# Patient Record
Sex: Male | Born: 1960
Health system: Southern US, Community
[De-identification: ages and names within clinical notes are randomized; demographics above are authoritative.]

## PROBLEM LIST (undated history)

## (undated) DIAGNOSIS — R7303 Prediabetes: Secondary | ICD-10-CM

## (undated) DIAGNOSIS — I10 Essential (primary) hypertension: Secondary | ICD-10-CM

## (undated) DIAGNOSIS — J302 Other seasonal allergic rhinitis: Secondary | ICD-10-CM

## (undated) HISTORY — PX: TONSILLECTOMY: SUR1361

## (undated) HISTORY — DX: Other seasonal allergic rhinitis: J30.2

## (undated) HISTORY — DX: Essential (primary) hypertension: I10

## (undated) HISTORY — DX: Prediabetes: R73.03

---

## 2012-03-23 ENCOUNTER — Other Ambulatory Visit: Payer: Self-pay | Admitting: Occupational Medicine

## 2012-03-23 ENCOUNTER — Ambulatory Visit
Admission: RE | Admit: 2012-03-23 | Discharge: 2012-03-23 | Disposition: A | Discharge status: Home / Self Care | Payer: No Typology Code available for payment source | Source: Ambulatory Visit | Attending: Occupational Medicine | Admitting: Occupational Medicine

## 2012-03-23 DIAGNOSIS — Z0000 Encounter for general adult medical examination without abnormal findings: Secondary | ICD-10-CM

## 2013-08-29 HISTORY — PX: EYE SURGERY: SHX253

## 2014-03-01 DIAGNOSIS — R7303 Prediabetes: Secondary | ICD-10-CM

## 2014-03-01 HISTORY — DX: Prediabetes: R73.03

## 2014-07-30 ENCOUNTER — Ambulatory Visit (INDEPENDENT_AMBULATORY_CARE_PROVIDER_SITE_OTHER): Payer: BLUE CROSS/BLUE SHIELD | Admitting: Neurology

## 2014-07-30 ENCOUNTER — Encounter: Payer: Self-pay | Admitting: Neurology

## 2014-07-30 VITALS — BP 143/93 | HR 68 | Temp 97.6°F | Ht 73.0 in | Wt 208.2 lb

## 2014-07-30 DIAGNOSIS — G609 Hereditary and idiopathic neuropathy, unspecified: Secondary | ICD-10-CM | POA: Diagnosis not present

## 2014-07-30 DIAGNOSIS — M5417 Radiculopathy, lumbosacral region: Secondary | ICD-10-CM | POA: Insufficient documentation

## 2014-07-30 NOTE — Patient Instructions (Signed)
Overall you are doing fairly well but I do want to suggest a few things today:   Remember to drink plenty of fluid, eat healthy meals and do not skip any meals. Try to eat protein with a every meal and eat a healthy snack such as fruit or nuts in between meals. Try to keep a regular sleep-wake schedule and try to exercise daily, particularly in the form of walking, 20-30 minutes a day, if you can.   As far as your medications are concerned, I would like to suggest: none at this time  As far as diagnostic testing: CT of the lumbar spine, labs  I would like to see you back as needed, sooner if we need to. Please call us with any interim questions, concerns, problems, updates or refill requests.   Please also call us for any test results so we can go over those with you on the phone.  My clinical assistant and will answer any of your questions and relay your messages to me and also relay most of my messages to you.   Our phone number is [phone]. We also have an after hours call service for urgent matters and there is a physician on-call for urgent questions. For any emergencies you know to call 911 or go to the nearest emergency room

## 2014-07-30 NOTE — Progress Notes (Signed)
[facility]    Provider:  Dr Jaynee Eagles Referring Provider: Hulan Fess, MD Primary Care Physician:  Gennette Pac, MD  CC:  Peripheral neuropathy  HPI:  Joseph Wilkinson is a 54 y.o. male here as a referral from Dr. Rex Kras for numbness in the toes. He has a past medical history of mixed dyslipidemia and prediabetes, dysthymia, hypertension. 3 years ago he started having numbness in the right foot, specifically tips of toes. Symptoms have spread to the left foot. The balls of the feet are involved too. He had a skin biopsy confirmed neuropathy. He started taking B12, B6 and folate however he denies any deficiencies. HgbA1c is 5.6. He follows with Hulan Fess, pcp. Symptoms not painful, but are disturbing and irritating. Worse with walking long distances. Less when he is laying down. In the morning he notices it more. Hurts more when he steps on a pebble, feels more a sense of pain than he should. No cramping. No current low back pain, but years ago he had a few instance of  radicular pain  (14-15 years ago) which resolved. He has some "twinges" that he recovered from but no issues in 7-8 years with his low back. No balance issues. No cramping. No changes in bowel or bladder.  Reviewed notes, labs and imaging from outside physicians, which showed: Last hemoglobin A1c was 5.8%, LDL 103. He was diagnosed with peripheral neuropathy after a biopsy, and no nerve conduction study has been done and has been receiving injections of vitamin B complex. He has a history of lumbar spine disease about 15 years ago, has had an MRI at that time but not since. He does see a Restaurant manager, fast food. He has also had tubular adenoma of the colon, colonoscopy up-to-date to be rechecked in March 2018.  Review of Systems: Patient complains of symptoms per HPI as well as the following symptoms: Weakness and allergies. Pertinent negatives per HPI. All others negative.   History   Social History  . Marital  Status: Married    Spouse Name: Almyra Free   . Number of Children: 2  . Years of Education: 16   Occupational History  . [employer]    Social History Main Topics  . Smoking status: Never Smoker   . Smokeless tobacco: Not on file  . Alcohol Use: 0.0 oz/week    0 Standard drinks or equivalent per week     Comment: 2-4 drinks week   . Drug Use: No  . Sexual Activity: Not on file   Other Topics Concern  . Not on file   Social History Narrative   Lives at home with wife and 2 kinds   Caffeine use: ocass tea    Family History  Problem Relation Age of Onset  . COPD Mother   . Ovarian cancer Father   . Seizures Daughter     Benign epilepsy     Past Medical History  Diagnosis Date  . Hypertension   . Pre-diabetes 2016  . Seasonal allergies     Past Surgical History  Procedure Laterality Date  . Tonsillectomy    . Eye surgery  July 2015    Scerla spacing     Current Outpatient Prescriptions  Medication Sig Dispense Refill  . Folic Acid-Vit G9-JME Q68 (FA-VITAMIN B-6-VITAMIN B-12 PO) Take 2 tablets by mouth. Every 2 days     No current facility-administered medications for this visit.    Allergies as of 07/30/2014  . (No Known Allergies)  Vitals: BP 143/93 mmHg  Pulse 68  Temp(Src) 97.6 F (36.4 C)  Ht 6\' 1"  (1.854 m)  Wt 208 lb 3.2 oz (94.439 kg)  BMI 27.47 kg/m2 Last Weight:  Wt Readings from Last 1 Encounters:  07/30/14 208 lb 3.2 oz (94.439 kg)   Last Height:   Ht Readings from Last 1 Encounters:  07/30/14 6\' 1"  (1.854 m)    Physical exam: Exam: Gen: NAD, conversant, well nourised, well groomed                     CV: RRR, no MRG. No Carotid Bruits. No peripheral edema, warm, nontender Eyes: Conjunctivae clear without exudates or hemorrhage  Neuro: Detailed Neurologic Exam  Speech:    Speech is normal; fluent and spontaneous with normal comprehension.  Cognition:    The patient is oriented to person, place, and time;      recent and remote memory intact;     language fluent;     normal attention, concentration,     fund of knowledge Cranial Nerves:    The pupils are equal, round, and reactive to light. The fundi are flat. Visual fields are full to finger confrontation. Extraocular movements are intact. Trigeminal sensation is intact and the muscles of mastication are normal. The face is symmetric. The palate elevates in the midline. Hearing intact. Voice is normal. Shoulder shrug is normal. The tongue has normal motion without fasciculations.   Coordination:    Normal finger to nose and heel to shin. Normal rapid alternating movements.   Gait:    Heel-toe and tandem gait are normal.   Motor Observation:    No asymmetry, no atrophy, and no involuntary movements noted. Tone:    Normal muscle tone.    Posture:    Posture is normal. normal erect    Strength:    Strength is V/V in the upper and lower limbs.      Sensation: Decreased in a stocking distribution to pp  and temp. vibration and proprioception intact distally.     Reflex Exam:  DTR's:    Deep tendon reflexes in the upper and lower extremities are normal bilaterally.   Toes:    The toes are downgoing bilaterally.   Clonus:    Clonus is absent.      Assessment/Plan:  54 year old very nice male with a history of small fiber neuropathy, by report verified with skin biopsy. Here for evaluation of his neuropathy as well as radiculopathy. Neuro exam significant for symmetrical small fiber greater than large fiber distal sensory changes. Otherwise unremarkable neurologic exam.  We'll order a complete serum workup for neuropathy. Patient is concerned that these changes might be coming from his low back, has a history of lumbar radiculopathy. Do not feel an MRI of the low back is warranted at this time but can check a CT to ensure no significant degenerative changes.   Sarina Ill, MD  [facility] [address]  [place], [place] [address]  Phone [phone] Fax [phone]

## 2014-08-01 LAB — COMPREHENSIVE METABOLIC PANEL
A/G RATIO: 2 (ref 1.1–2.5)
ALBUMIN: 4.3 g/dL (ref 3.5–5.5)
ALK PHOS: 93 IU/L (ref 39–117)
ALT: 25 IU/L (ref 0–44)
AST: 18 IU/L (ref 0–40)
BILIRUBIN TOTAL: 0.7 mg/dL (ref 0.0–1.2)
BUN / CREAT RATIO: 11 (ref 9–20)
BUN: 11 mg/dL (ref 6–24)
CHLORIDE: 102 mmol/L (ref 97–108)
CO2: 22 mmol/L (ref 18–29)
CREATININE: 0.96 mg/dL (ref 0.76–1.27)
Calcium: 9.5 mg/dL (ref 8.7–10.2)
GFR, EST AFRICAN AMERICAN: 102 mL/min/{1.73_m2} (ref >59)
GFR, EST NON AFRICAN AMERICAN: 89 mL/min/{1.73_m2} (ref >59)
GLOBULIN, TOTAL: 2.2 g/dL (ref 1.5–4.5)
Glucose: 97 mg/dL (ref 65–99)
POTASSIUM: 4.8 mmol/L (ref 3.5–5.2)
Sodium: 140 mmol/L (ref 134–144)
Total Protein: 6.5 g/dL (ref 6.0–8.5)

## 2014-08-01 LAB — MULTIPLE MYELOMA PANEL, SERUM
ALPHA2 GLOB SERPL ELPH-MCNC: 0.6 g/dL (ref 0.4–1.2)
Albumin SerPl Elph-Mcnc: 3.9 g/dL (ref 3.2–5.6)
Albumin/Glob SerPl: 1.6 (ref 0.7–2.0)
Alpha 1: 0.2 g/dL (ref 0.1–0.4)
B-GLOBULIN SERPL ELPH-MCNC: 1 g/dL (ref 0.6–1.3)
GAMMA GLOB SERPL ELPH-MCNC: 0.8 g/dL (ref 0.5–1.6)
GLOBULIN, TOTAL: 2.6 g/dL (ref 2.0–4.5)
IGG (IMMUNOGLOBIN G), SERUM: 721 mg/dL (ref 700–1600)
IgA/Immunoglobulin A, Serum: 223 mg/dL (ref 90–386)
IgM (Immunoglobulin M), Srm: 95 mg/dL (ref 20–172)

## 2014-08-01 LAB — PAN-ANCA
Myeloperoxidase Ab: <9 U/mL (ref 0.0–9.0)
P-ANCA: <1:20 {titer}

## 2014-08-01 LAB — TSH: TSH: 3.22 u[IU]/mL (ref 0.450–4.500)

## 2014-08-01 LAB — ANA W/REFLEX: ANA: NEGATIVE

## 2014-08-01 LAB — B. BURGDORFI ANTIBODIES: Lyme IgG/IgM Ab: <0.91 {ISR} (ref 0.00–0.90)

## 2014-08-01 LAB — B12 AND FOLATE PANEL

## 2014-08-01 LAB — RHEUMATOID FACTOR: RHEUMATOID FACTOR: 8 [IU]/mL (ref 0.0–13.9)

## 2014-08-01 LAB — HEPATITIS C ANTIBODY

## 2014-08-01 LAB — RPR: RPR: NONREACTIVE

## 2014-08-01 LAB — HIV ANTIBODY (ROUTINE TESTING W REFLEX): HIV SCREEN 4TH GENERATION: NONREACTIVE

## 2014-08-01 LAB — VITAMIN B1: THIAMINE: 150.6 nmol/L (ref 66.5–200.0)

## 2014-08-01 LAB — ANGIOTENSIN CONVERTING ENZYME: ANGIO CONVERT ENZYME: 32 U/L (ref 14–82)

## 2014-08-02 ENCOUNTER — Telehealth: Payer: Self-pay

## 2014-08-02 NOTE — Telephone Encounter (Signed)
VM left to inform patient to call office back to receive Results.

## 2014-08-05 NOTE — Telephone Encounter (Signed)
Spoke with pt about normal lab results. Pt verbalized understanding.

## 2015-06-05 DIAGNOSIS — M9905 Segmental and somatic dysfunction of pelvic region: Secondary | ICD-10-CM | POA: Diagnosis not present

## 2015-06-05 DIAGNOSIS — M5417 Radiculopathy, lumbosacral region: Secondary | ICD-10-CM | POA: Diagnosis not present

## 2015-06-05 DIAGNOSIS — Q72811 Congenital shortening of right lower limb: Secondary | ICD-10-CM | POA: Diagnosis not present

## 2015-06-05 DIAGNOSIS — M9903 Segmental and somatic dysfunction of lumbar region: Secondary | ICD-10-CM | POA: Diagnosis not present

## 2015-08-05 ENCOUNTER — Ambulatory Visit
Admission: RE | Admit: 2015-08-05 | Discharge: 2015-08-05 | Disposition: A | Discharge status: Home / Self Care | Payer: BLUE CROSS/BLUE SHIELD | Source: Ambulatory Visit | Attending: Family Medicine | Admitting: Family Medicine

## 2015-08-05 ENCOUNTER — Other Ambulatory Visit: Payer: Self-pay | Admitting: Family Medicine

## 2015-08-05 DIAGNOSIS — M545 Low back pain: Secondary | ICD-10-CM

## 2015-09-30 DIAGNOSIS — R030 Elevated blood-pressure reading, without diagnosis of hypertension: Secondary | ICD-10-CM | POA: Diagnosis not present

## 2015-09-30 DIAGNOSIS — R202 Paresthesia of skin: Secondary | ICD-10-CM | POA: Diagnosis not present

## 2015-09-30 DIAGNOSIS — R200 Anesthesia of skin: Secondary | ICD-10-CM | POA: Diagnosis not present

## 2015-09-30 DIAGNOSIS — M47816 Spondylosis without myelopathy or radiculopathy, lumbar region: Secondary | ICD-10-CM | POA: Diagnosis not present

## 2016-01-07 DIAGNOSIS — Z0000 Encounter for general adult medical examination without abnormal findings: Secondary | ICD-10-CM | POA: Diagnosis not present

## 2016-07-19 ENCOUNTER — Encounter (INDEPENDENT_AMBULATORY_CARE_PROVIDER_SITE_OTHER): Payer: Self-pay

## 2016-07-19 ENCOUNTER — Ambulatory Visit (INDEPENDENT_AMBULATORY_CARE_PROVIDER_SITE_OTHER): Payer: BLUE CROSS/BLUE SHIELD | Admitting: Neurology

## 2016-07-19 ENCOUNTER — Encounter: Payer: Self-pay | Admitting: Neurology

## 2016-07-19 VITALS — BP 130/83 | HR 68 | Resp 20 | Ht 73.0 in | Wt 217.0 lb

## 2016-07-19 DIAGNOSIS — R7309 Other abnormal glucose: Secondary | ICD-10-CM

## 2016-07-19 DIAGNOSIS — G609 Hereditary and idiopathic neuropathy, unspecified: Secondary | ICD-10-CM

## 2016-07-19 NOTE — Progress Notes (Signed)
[facility]    Provider:  Dr Jaynee Eagles Referring Provider: Hulan Fess, MD Primary Care Physician:  Hulan Fess, MD  CC:  Numbness in feet  Follow up 07/19/2016:  Joseph Wilkinson is a 56 y.o. male here as a referral from Dr. Rex Kras for numbness in the feet.  Past medical history dyslipidemia, trig;yceridemia, overweight, fatigue, dysthymia, prediabetes, tubular adenoma of the colon, BPH, peripheral neuropathy, vitamin D deficiency, hypertension. Patient was originally evaluated for the same condition in 2016. At that time last hemoglobin A1c was 5.8. He was diagnosed with peripheral neuropathy after a biopsy and was receiving injections of vitamin B complex. He also has a history of lumbar spinal disease. Lab workup included angiotensin-converting enzyme, TSH, HIV, B12 and folate, pan-Anka, hepatitis C, rheumatoid factor, B1, multiple myeloma panel, Lyme, RPR, ANA with reflex and CMP which were all normal. He went to chiropratic, podiatry, laser therapy on his feet. Dr. Rex Kras check B12 in November and he says was normal. He says his hgba1c is 5.6 now. He is not taking b vitamins anymore. He does not live in an older home, no exposure to heavy metals or well water. He drinks 4-5 drinks a week, no significant alcohol intake in the past. No chemo or radiation. The numbness is stable, not painful but annoying. He has had severe back pain 15 years ago and continues to have back pain. The numbness is symmetrical in the fall of the foot at his big toe and all of his toes on the bottom. Started 8 years ago and minimally progressive.  8 years ago it was just on the right toe.   Initial visit 07/30/2014::  Joseph Wilkinson is a 56 y.o. male here as a referral from Dr. Rex Kras for numbness in the toes. He has a past medical history of mixed dyslipidemia and prediabetes, dysthymia, hypertension. 3 years ago he started having numbness in the right foot, specifically tips of toes. Symptoms have spread  to the left foot. The balls of the feet are involved too. He had a skin biopsy confirmed neuropathy. He started taking B12, B6 and folate however he denies any deficiencies. HgbA1c is 5.6. He follows with Hulan Fess, pcp. Symptoms not painful, but are disturbing and irritating. Worse with walking long distances. Less when he is laying down. In the morning he notices it more. Hurts more when he steps on a pebble, feels more a sense of pain than he should. No cramping. No current low back pain, but years ago he had a few instance of  radicular pain  (14-15 years ago) which resolved. He has some "twinges" that he recovered from but no issues in 7-8 years with his low back. No balance issues. No cramping. No changes in bowel or bladder.  Reviewed notes, labs and imaging from outside physicians, which showed: Last hemoglobin A1c was 5.8%, LDL 103. He was diagnosed with peripheral neuropathy after a biopsy, and no nerve conduction study has been done and has been receiving injections of vitamin B complex. He has a history of lumbar spine disease about 15 years ago, has had an MRI at that time but not since. He does see a Restaurant manager, fast food. He has also had tubular adenoma of the colon, colonoscopy up-to-date to be rechecked in March 2018.   Review of Systems: Patient complains of symptoms per HPI as well as the following symptoms; numbness, no CP or SOB. Pertinent negatives per HPI. All others negative.   Social History   Social History  .  Marital status: Married    Spouse name: Almyra Free   . Number of children: 2  . Years of education: 16   Occupational History  . [employer]    Social History Main Topics  . Smoking status: Never Smoker  . Smokeless tobacco: Never Used  . Alcohol use 0.0 oz/week     Comment: 2-4 drinks week   . Drug use: No  . Sexual activity: Not on file   Other Topics Concern  . Not on file   Social History Narrative   Lives at home with wife and 2 kinds   Caffeine  use: ocass tea    Family History  Problem Relation Age of Onset  . COPD Mother   . Ovarian cancer Father   . Seizures Daughter        Benign epilepsy     Past Medical History:  Diagnosis Date  . Hypertension   . Pre-diabetes 2016  . Seasonal allergies     Past Surgical History:  Procedure Laterality Date  . EYE SURGERY  July 2015   Scerla spacing   . TONSILLECTOMY      Current Outpatient Prescriptions  Medication Sig Dispense Refill  . amLODipine (NORVASC) 5 MG tablet Take 5 mg by mouth daily.    . Ergocalciferol (VITAMIN D2) 2000 units TABS Take by mouth.    . Omega 3 1000 MG CAPS Take by mouth.     No current facility-administered medications for this visit.     Allergies as of 07/19/2016  . (No Known Allergies)    Vitals: BP 130/83   Pulse 68   Resp 20   Ht '6\' 1"'  (1.854 m)   Wt 217 lb (98.4 kg)   BMI 28.63 kg/m  Last Weight:  Wt Readings from Last 1 Encounters:  07/19/16 217 lb (98.4 kg)   Last Height:   Ht Readings from Last 1 Encounters:  07/19/16 '6\' 1"'  (1.854 m)   Physical exam: Exam: Gen: NAD, conversant, well nourised, obese, well groomed                     Eyes: Conjunctivae clear without exudates or hemorrhage  Neuro: Detailed Neurologic Exam  Speech:    Speech is normal; fluent and spontaneous with normal comprehension.  Cognition:    The patient is oriented to person, place, and time;    Cranial Nerves:    The pupils are equal, round, and reactive to light. Visual fields are full to finger confrontation. Extraocular movements are intact. Trigeminal sensation is intact and the muscles of mastication are normal. The face is symmetric. The palate elevates in the midline. Hearing intact. Voice is normal. Shoulder shrug is normal. The tongue has normal motion without fasciculations.   Gait:    Normal native gait, no ataxia or imbalalnce  Motor Observation:    No asymmetry, no atrophy, and no involuntary movements noted. Tone:     Normal muscle tone.    Posture:    Posture is normal. normal erect    Strength:    Strength is V/V in the upper and lower limbs.      Sensation: decreased pin prick and vibration distally in the LE.      Reflex Exam:  DTR's:    Deep tendon reflexes in the upper and lower extremities are normal bilaterally.   Toes:    The toes are downgoing bilaterally.        Assessment/Plan:   Joseph Wilkinson is  a 56 y.o. male here as a referral from Dr. Rex Kras for numbness in the feet. 56 year old very nice male with a history of small fiber neuropathy, by report verified with skin biopsy. Patient was originally evaluated for the same condition in 2016. At that time last hemoglobin A1c was 5.8. He was diagnosed with peripheral neuropathy after a biopsy and was receiving injections of vitamin B complex. He also has a history of lumbar spinal disease. Lab workup included angiotensin-converting enzyme, TSH, HIV, B12 and folate, pan-Anka, hepatitis C, rheumatoid factor, B1, multiple myeloma panel, Lyme, RPR, ANA with reflex and CMP which were all normal.Neuro exam significant for symmetrical small fiber greater than large fiber distal sensory changes. Otherwise unremarkable neurologic exam.  We'll order a complete serum workup for neuropathy. Patient is concerned that these changes might be coming from his low back,  Emg/ncs Labs  Alpha-lipoic acid. Recommend MRI Lumbar spine if emg/ncs inconclusive Emg/ncs bilateral LE with distal tarsal tunnel tesing and one arm.   Sarina Ill, MD  [facility] [address] [place], [place] [address]  Phone [phone] Fax [phone]  A total of 40 minutes was spent face-to-face with this patient. Over half this time was spent on counseling patient on the idiopathic peripheral neuropathy diagnosis and different diagnostic and therapeutic options available.

## 2016-07-19 NOTE — Patient Instructions (Signed)
Remember to drink plenty of fluid, eat healthy meals and do not skip any meals. Try to eat protein with a every meal and eat a healthy snack such as fruit or nuts in between meals. Try to keep a regular sleep-wake schedule and try to exercise daily, particularly in the form of walking, 20-30 minutes a day, if you can.   As far as your medications are concerned, I would like to suggest: Alpha-Lipoic Acid 400-600mg   As far as diagnostic testing: labs, emg/ncs  I would like to see you back for emg/ncs, sooner if we need to. Please call us with any interim questions, concerns, problems, updates or refill requests.    Our phone number is [phone]. We also have an after hours call service for urgent matters and there is a physician on-call for urgent questions. For any emergencies you know to call 911 or go to the nearest emergency room

## 2016-07-22 ENCOUNTER — Telehealth: Payer: Self-pay | Admitting: *Deleted

## 2016-07-22 LAB — SJOGREN'S SYNDROME ANTIBODS(SSA + SSB): ENA SSA (RO) Ab: <0.2 AI (ref 0.0–0.9)

## 2016-07-22 LAB — VITAMIN B6: Vitamin B6: 12.6 ug/L (ref 5.3–46.7)

## 2016-07-22 NOTE — Telephone Encounter (Signed)
LVM informing patient his labs are normal. Left number for any questions. 

## 2016-08-19 ENCOUNTER — Ambulatory Visit (INDEPENDENT_AMBULATORY_CARE_PROVIDER_SITE_OTHER): Payer: Self-pay | Admitting: Neurology

## 2016-08-19 ENCOUNTER — Ambulatory Visit (INDEPENDENT_AMBULATORY_CARE_PROVIDER_SITE_OTHER): Payer: BLUE CROSS/BLUE SHIELD | Admitting: Neurology

## 2016-08-19 ENCOUNTER — Encounter: Payer: Self-pay | Admitting: Neurology

## 2016-08-19 DIAGNOSIS — R202 Paresthesia of skin: Secondary | ICD-10-CM | POA: Diagnosis not present

## 2016-08-19 DIAGNOSIS — E538 Deficiency of other specified B group vitamins: Secondary | ICD-10-CM

## 2016-08-19 DIAGNOSIS — R200 Anesthesia of skin: Secondary | ICD-10-CM | POA: Diagnosis not present

## 2016-08-19 DIAGNOSIS — G609 Hereditary and idiopathic neuropathy, unspecified: Secondary | ICD-10-CM | POA: Diagnosis not present

## 2016-08-19 DIAGNOSIS — G629 Polyneuropathy, unspecified: Secondary | ICD-10-CM

## 2016-08-19 DIAGNOSIS — R29898 Other symptoms and signs involving the musculoskeletal system: Secondary | ICD-10-CM

## 2016-08-19 DIAGNOSIS — M5416 Radiculopathy, lumbar region: Secondary | ICD-10-CM

## 2016-08-19 DIAGNOSIS — Z0289 Encounter for other administrative examinations: Secondary | ICD-10-CM

## 2016-08-19 NOTE — Progress Notes (Signed)
See procedure note.

## 2016-08-19 NOTE — Progress Notes (Signed)
Full Name: Joseph Wilkinson Gender: Male MRN #: [id number] Date of Birth: 1960-10-09    Visit Date: 08/19/2016 07:59 Age: 56 Years 35 Months Old Examining Physician: Sarina Ill, MD  Referring Physician: Hulan Fess, MD    History: Numbness in the feet  Summary:  The right tibial motor nerve showed reduced amplitude (1.2 mV, N>4) and decreased conduction velocity (3m/s, N>41). The left tibial motor nerve showed reduced amplitude (1.9 mV, N>4) . The left sural sensory nerve showed reduced amplitude (4 V, N>6). The right superficial peroneal sensory nerve showed reduced amplitude (5 V, N>6).The left superficial peroneal sensory nerve showed reduced amplitude (2 V,N> 6). The right tibial F wave showed delayed latency (62.1 ms, N<56). The left tibial F wave showed delayed (59.7 ms, N<56).   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 Reduced amplitude of the tibial motor nerves and delayed latencies of the tibial F waves could be due to polyneuropathy or radiculopathy however EMG did not show any acute/ongoing denervation or chronic neurogenic changes to indicate radiculopathy. Consider MRI of the lumbar spine.   Sarina Ill, M.D.  [facility] [place], [place] [address] Tel: [phone] Fax: [phone]        [facility]    Nerve / Sites Rec. Site Latency Ref. Amplitude Ref. Rel Amp Segments Distance Velocity Ref. Area    ms ms mV mV %  cm m/s m/s mVms  R Ulnar - ADM     Wrist ADM 2.6 ?3.3 6.6 ?6.0 100 Wrist - ADM 7   19.7     B.Elbow ADM 6.6  6.0  90.9 B.Elbow - Wrist 22 54 ?49 19.4     A.Elbow ADM 8.9  5.7  95.2 A.Elbow - B.Elbow 12 52 ?49 18.9         A.Elbow - Wrist      R Peroneal - EDB     Ankle EDB 5.0 ?6.5 3.0 ?2.0 100 Ankle - EDB 9   6.8     Fib head EDB 13.3  1.9  63.7 Fib head - Ankle 36 43 ?44 6.1     Pop fossa EDB 15.5  1.9  98.5 Pop fossa - Fib head 10 45 ?44 6.4         Pop fossa - Ankle        L Peroneal - EDB     Ankle EDB 4.8 ?6.5 4.4 ?2.0 100 Ankle - EDB 9   10.3     Fib head EDB 13.4  4.3  98.5 Fib head - Ankle 36 42 ?44 10.0     Pop fossa EDB 15.8  3.7  84.4 Pop fossa - Fib head 10 43 ?44 8.7         Pop fossa - Ankle      R Tibial - AH     Ankle AH 5.2 ?5.8 1.2 ?4.0 100 Ankle - AH 9   2.4     Pop fossa AH 16.6  0.8  68.2 Pop fossa - Ankle 44 39 ?41 1.8  L Tibial - AH     Ankle AH 6.2 ?5.8 1.9 ?4.0 100 Ankle - AH 9   6.3     Pop fossa AH 16.9  1.9  98 Pop fossa - Ankle 44 41 ?41 6.1               SNC    Nerve / Sites Rec. Site Peak Lat Ref.  Amp Ref. Segments Distance    ms ms V V  cm  R Sural - Ankle (Calf)     Calf Ankle 3.80 ?4.40 6 ?6 Calf - Ankle 14  L Sural - Ankle (Calf)     Calf Ankle 3.54 ?4.40 4 ?6 Calf - Ankle 14  R Superficial peroneal - Ankle     Lat leg Ankle 4.17 ?4.40 5 ?6 Lat leg - Ankle 14  L Superficial peroneal - Ankle     Lat leg Ankle 4.06 ?4.40 2 ?6 Lat leg - Ankle 14  R Ulnar - Orthodromic, (Dig V, Mid palm)     Dig V Wrist 2.71 ?3.10 4 ?4 Dig V - Wrist 78               F  Wave    Nerve F Lat Ref.   ms ms  R Tibial - AH 62.1 ?56.0  L Tibial - AH 59.7 ?56.0  R Ulnar - ADM 30.9 ?32.0           H Reflex    Nerve H Lat Lat Hmax   ms ms   Left Right Ref. Left Right Ref.  Tibial - [place]us 32.9 33.7 ?35.0 33.0 33.7 ?35.0         EMG full       EMG Summary Table    Spontaneous MUAP Recruitment  Muscle IA Fib PSW Fasc Other Amp Dur. Poly Pattern  R. Vastus medialis Normal None None None _______ Normal Normal Normal Normal  R. Tibialis anterior Normal None None None _______ Normal Normal Normal Normal  R. Gastrocnemius (Medial head) Normal None None None _______ Normal Normal Normal Normal  R. Extensor hallucis longus Normal None None None _______ Normal Normal Normal Normal  R. Abductor hallucis Normal None None None _______ Normal Normal Normal Normal  R. Biceps femoris (long head) Normal None None None _______ Normal Normal  Normal Normal  R. Gluteus medius Normal None None None _______ Normal Normal Normal Normal  R. Gluteus maximus Normal None None None _______ Normal Normal Normal Normal  R. Lumbar paraspinals (mid) Normal None None None _______ Normal Normal Normal Normal  R. Lumbar paraspinals (low) Normal None None None _______ Normal Normal Normal Normal

## 2016-08-20 ENCOUNTER — Telehealth: Payer: Self-pay | Admitting: *Deleted

## 2016-08-20 NOTE — Telephone Encounter (Signed)
LVM informing patient his Vit B12 level is normal, advised he had another test which is related to Vit B12 deficiency however test was not approved by FDA. Advised however that his B12 is normal. Left number, office hours for any questions.

## 2016-08-21 LAB — METHYLMALONIC ACID, SERUM: Methylmalonic Acid: 122 nmol/L (ref 0–378)

## 2016-08-21 LAB — VITAMIN B12: VITAMIN B 12: 657 pg/mL (ref 232–1245)

## 2016-08-25 NOTE — Procedures (Signed)
Full Name: Lenord Fralix Gender: Male MRN #: [id number] Date of Birth: 1960-12-27    Visit Date: 08/19/2016 07:59 Age: 56 Years 25 Months Old Examining Physician: Sarina Ill, MD  Referring Physician: Hulan Fess, MD    History: Numbness in the feet  Summary:  The right tibial motor nerve showed reduced amplitude (1.2 mV, N>4) and decreased conduction velocity (7m/s, N>41). The left tibial motor nerve showed reduced amplitude (1.9 mV, N>4) . The left sural sensory nerve showed reduced amplitude (4 V, N>6). The right superficial peroneal sensory nerve showed reduced amplitude (5 V, N>6).The left superficial peroneal sensory nerve showed reduced amplitude (2 V,N> 6). The right tibial F wave showed delayed latency (62.1 ms, N<56). The left tibial F wave showed delayed (59.7 ms, N<56).   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 Reduced amplitude of the tibial motor nerves and delayed latencies of the tibial F waves could be due to polyneuropathy or radiculopathy however EMG did not show any acute/ongoing denervation or chronic neurogenic changes to indicate radiculopathy. Consider MRI of the lumbar spine.   Sarina Ill, M.D.  [facility] [place], [place] [address] Tel: [phone] Fax: [phone]        [facility]    Nerve / Sites Rec. Site Latency Ref. Amplitude Ref. Rel Amp Segments Distance Velocity Ref. Area    ms ms mV mV %  cm m/s m/s mVms  R Ulnar - ADM     Wrist ADM 2.6 ?3.3 6.6 ?6.0 100 Wrist - ADM 7   19.7     B.Elbow ADM 6.6  6.0  90.9 B.Elbow - Wrist 22 54 ?49 19.4     A.Elbow ADM 8.9  5.7  95.2 A.Elbow - B.Elbow 12 52 ?49 18.9         A.Elbow - Wrist      R Peroneal - EDB     Ankle EDB 5.0 ?6.5 3.0 ?2.0 100 Ankle - EDB 9   6.8     Fib head EDB 13.3  1.9  63.7 Fib head - Ankle 36 43 ?44 6.1     Pop fossa EDB 15.5  1.9  98.5 Pop fossa - Fib head 10 45 ?44 6.4         Pop fossa - Ankle        L Peroneal - EDB     Ankle EDB 4.8 ?6.5 4.4 ?2.0 100 Ankle - EDB 9   10.3     Fib head EDB 13.4  4.3  98.5 Fib head - Ankle 36 42 ?44 10.0     Pop fossa EDB 15.8  3.7  84.4 Pop fossa - Fib head 10 43 ?44 8.7         Pop fossa - Ankle      R Tibial - AH     Ankle AH 5.2 ?5.8 1.2 ?4.0 100 Ankle - AH 9   2.4     Pop fossa AH 16.6  0.8  68.2 Pop fossa - Ankle 44 39 ?41 1.8  L Tibial - AH     Ankle AH 6.2 ?5.8 1.9 ?4.0 100 Ankle - AH 9   6.3     Pop fossa AH 16.9  1.9  98 Pop fossa - Ankle 44 41 ?41 6.1               SNC    Nerve / Sites Rec. Site Peak Lat Ref.  Amp Ref. Segments Distance    ms ms V V  cm  R Sural - Ankle (Calf)     Calf Ankle 3.80 ?4.40 6 ?6 Calf - Ankle 14  L Sural - Ankle (Calf)     Calf Ankle 3.54 ?4.40 4 ?6 Calf - Ankle 14  R Superficial peroneal - Ankle     Lat leg Ankle 4.17 ?4.40 5 ?6 Lat leg - Ankle 14  L Superficial peroneal - Ankle     Lat leg Ankle 4.06 ?4.40 2 ?6 Lat leg - Ankle 14  R Ulnar - Orthodromic, (Dig V, Mid palm)     Dig V Wrist 2.71 ?3.10 4 ?4 Dig V - Wrist 54               F  Wave    Nerve F Lat Ref.   ms ms  R Tibial - AH 62.1 ?56.0  L Tibial - AH 59.7 ?56.0  R Ulnar - ADM 30.9 ?32.0           H Reflex    Nerve H Lat Lat Hmax   ms ms   Left Right Ref. Left Right Ref.  Tibial - Soleus 32.9 33.7 ?35.0 33.0 33.7 ?35.0         EMG full       EMG Summary Table    Spontaneous MUAP Recruitment  Muscle IA Fib PSW Fasc Other Amp Dur. Poly Pattern  R. Vastus medialis Normal None None None _______ Normal Normal Normal Normal  R. Tibialis anterior Normal None None None _______ Normal Normal Normal Normal  R. Gastrocnemius (Medial head) Normal None None None _______ Normal Normal Normal Normal  R. Extensor hallucis longus Normal None None None _______ Normal Normal Normal Normal  R. Abductor hallucis Normal None None None _______ Normal Normal Normal Normal  R. Biceps femoris (long head) Normal None None None _______ Normal Normal  Normal Normal  R. Gluteus medius Normal None None None _______ Normal Normal Normal Normal  R. Gluteus maximus Normal None None None _______ Normal Normal Normal Normal  R. Lumbar paraspinals (mid) Normal None None None _______ Normal Normal Normal Normal  R. Lumbar paraspinals (low) Normal None None None _______ Normal Normal Normal Normal

## 2016-08-26 DIAGNOSIS — M9903 Segmental and somatic dysfunction of lumbar region: Secondary | ICD-10-CM | POA: Diagnosis not present

## 2016-08-26 DIAGNOSIS — M5136 Other intervertebral disc degeneration, lumbar region: Secondary | ICD-10-CM | POA: Diagnosis not present

## 2016-09-02 DIAGNOSIS — M9903 Segmental and somatic dysfunction of lumbar region: Secondary | ICD-10-CM | POA: Diagnosis not present

## 2016-09-02 DIAGNOSIS — M5136 Other intervertebral disc degeneration, lumbar region: Secondary | ICD-10-CM | POA: Diagnosis not present

## 2016-09-07 DIAGNOSIS — M9903 Segmental and somatic dysfunction of lumbar region: Secondary | ICD-10-CM | POA: Diagnosis not present

## 2016-09-07 DIAGNOSIS — M5136 Other intervertebral disc degeneration, lumbar region: Secondary | ICD-10-CM | POA: Diagnosis not present

## 2016-09-09 DIAGNOSIS — M9903 Segmental and somatic dysfunction of lumbar region: Secondary | ICD-10-CM | POA: Diagnosis not present

## 2016-09-09 DIAGNOSIS — M5136 Other intervertebral disc degeneration, lumbar region: Secondary | ICD-10-CM | POA: Diagnosis not present

## 2016-09-16 DIAGNOSIS — M5136 Other intervertebral disc degeneration, lumbar region: Secondary | ICD-10-CM | POA: Diagnosis not present

## 2016-09-16 DIAGNOSIS — M9903 Segmental and somatic dysfunction of lumbar region: Secondary | ICD-10-CM | POA: Diagnosis not present

## 2016-09-20 DIAGNOSIS — M5136 Other intervertebral disc degeneration, lumbar region: Secondary | ICD-10-CM | POA: Diagnosis not present

## 2016-09-20 DIAGNOSIS — M9903 Segmental and somatic dysfunction of lumbar region: Secondary | ICD-10-CM | POA: Diagnosis not present

## 2016-09-27 DIAGNOSIS — M5136 Other intervertebral disc degeneration, lumbar region: Secondary | ICD-10-CM | POA: Diagnosis not present

## 2016-09-27 DIAGNOSIS — M9903 Segmental and somatic dysfunction of lumbar region: Secondary | ICD-10-CM | POA: Diagnosis not present

## 2016-09-30 DIAGNOSIS — M9903 Segmental and somatic dysfunction of lumbar region: Secondary | ICD-10-CM | POA: Diagnosis not present

## 2016-09-30 DIAGNOSIS — M5136 Other intervertebral disc degeneration, lumbar region: Secondary | ICD-10-CM | POA: Diagnosis not present

## 2016-10-11 DIAGNOSIS — M9903 Segmental and somatic dysfunction of lumbar region: Secondary | ICD-10-CM | POA: Diagnosis not present

## 2016-10-11 DIAGNOSIS — M5136 Other intervertebral disc degeneration, lumbar region: Secondary | ICD-10-CM | POA: Diagnosis not present

## 2017-01-13 DIAGNOSIS — Z0000 Encounter for general adult medical examination without abnormal findings: Secondary | ICD-10-CM | POA: Diagnosis not present

## 2017-05-02 DIAGNOSIS — Z86010 Personal history of colonic polyps: Secondary | ICD-10-CM | POA: Diagnosis not present

## 2017-05-02 DIAGNOSIS — K648 Other hemorrhoids: Secondary | ICD-10-CM | POA: Diagnosis not present

## 2017-05-02 DIAGNOSIS — D126 Benign neoplasm of colon, unspecified: Secondary | ICD-10-CM | POA: Diagnosis not present

## 2017-05-04 DIAGNOSIS — D126 Benign neoplasm of colon, unspecified: Secondary | ICD-10-CM | POA: Diagnosis not present

## 2017-10-20 DIAGNOSIS — F338 Other recurrent depressive disorders: Secondary | ICD-10-CM | POA: Diagnosis not present

## 2018-01-19 DIAGNOSIS — Z86010 Personal history of colonic polyps: Secondary | ICD-10-CM | POA: Diagnosis not present

## 2018-01-19 DIAGNOSIS — E291 Testicular hypofunction: Secondary | ICD-10-CM | POA: Diagnosis not present

## 2018-01-19 DIAGNOSIS — I10 Essential (primary) hypertension: Secondary | ICD-10-CM | POA: Diagnosis not present

## 2018-01-19 DIAGNOSIS — Z0000 Encounter for general adult medical examination without abnormal findings: Secondary | ICD-10-CM | POA: Diagnosis not present

## 2018-01-19 DIAGNOSIS — R8290 Unspecified abnormal findings in urine: Secondary | ICD-10-CM | POA: Diagnosis not present

## 2018-08-04 DIAGNOSIS — F338 Other recurrent depressive disorders: Secondary | ICD-10-CM | POA: Diagnosis not present

## 2019-02-08 DIAGNOSIS — R7301 Impaired fasting glucose: Secondary | ICD-10-CM | POA: Diagnosis not present

## 2019-02-08 DIAGNOSIS — I10 Essential (primary) hypertension: Secondary | ICD-10-CM | POA: Diagnosis not present

## 2019-02-08 DIAGNOSIS — F338 Other recurrent depressive disorders: Secondary | ICD-10-CM | POA: Diagnosis not present

## 2019-02-08 DIAGNOSIS — Z0000 Encounter for general adult medical examination without abnormal findings: Secondary | ICD-10-CM | POA: Diagnosis not present

## 2019-02-12 DIAGNOSIS — Z03818 Encounter for observation for suspected exposure to other biological agents ruled out: Secondary | ICD-10-CM | POA: Diagnosis not present

## 2019-03-15 DIAGNOSIS — C44612 Basal cell carcinoma of skin of right upper limb, including shoulder: Secondary | ICD-10-CM | POA: Diagnosis not present

## 2019-03-15 DIAGNOSIS — L570 Actinic keratosis: Secondary | ICD-10-CM | POA: Diagnosis not present

## 2019-03-15 DIAGNOSIS — D235 Other benign neoplasm of skin of trunk: Secondary | ICD-10-CM | POA: Diagnosis not present

## 2019-03-15 DIAGNOSIS — C4491 Basal cell carcinoma of skin, unspecified: Secondary | ICD-10-CM

## 2019-03-15 DIAGNOSIS — D229 Melanocytic nevi, unspecified: Secondary | ICD-10-CM

## 2019-03-15 HISTORY — DX: Melanocytic nevi, unspecified: D22.9

## 2019-03-15 HISTORY — DX: Basal cell carcinoma of skin, unspecified: C44.91

## 2019-04-19 DIAGNOSIS — D235 Other benign neoplasm of skin of trunk: Secondary | ICD-10-CM | POA: Diagnosis not present

## 2019-05-31 ENCOUNTER — Other Ambulatory Visit: Payer: Self-pay

## 2019-05-31 ENCOUNTER — Ambulatory Visit: Payer: BC Managed Care – PPO | Admitting: Physician Assistant

## 2019-05-31 ENCOUNTER — Encounter: Payer: Self-pay | Admitting: Physician Assistant

## 2019-05-31 DIAGNOSIS — Z86018 Personal history of other benign neoplasm: Secondary | ICD-10-CM | POA: Diagnosis not present

## 2019-05-31 DIAGNOSIS — D485 Neoplasm of uncertain behavior of skin: Secondary | ICD-10-CM

## 2019-05-31 DIAGNOSIS — L905 Scar conditions and fibrosis of skin: Secondary | ICD-10-CM | POA: Diagnosis not present

## 2019-05-31 DIAGNOSIS — L700 Acne vulgaris: Secondary | ICD-10-CM | POA: Diagnosis not present

## 2019-05-31 DIAGNOSIS — Z85828 Personal history of other malignant neoplasm of skin: Secondary | ICD-10-CM

## 2019-05-31 DIAGNOSIS — D492 Neoplasm of unspecified behavior of bone, soft tissue, and skin: Secondary | ICD-10-CM

## 2019-05-31 DIAGNOSIS — L430 Hypertrophic lichen planus: Secondary | ICD-10-CM | POA: Diagnosis not present

## 2019-05-31 NOTE — Patient Instructions (Signed)

## 2019-05-31 NOTE — Progress Notes (Addendum)
Follow-Up Visit   Subjective  Joseph Wilkinson is a 59 y.o. male who presents for the following: Blackheads (Here for multiple blackheads across back.) and Follow-up (recheck right post shoulder (BCC) and left mid paraspinal (atypical) widershace done 04/18/2029). New pink scale left lower back, no symptoms, pt was unaware of the lesion. Re check bcc treated on right posterior shoulder.   The following portions of the chart were reviewed this encounter and updated as appropriate: Tobacco  Allergies  Meds  Problems  Med Hx  Surg Hx  Fam Hx  Soc Hx     Objective  Well appearing patient in no apparent distress; mood and affect are within normal limits.  All skin waist up examined.  Objective  Right Upper Back: Well healed scar with no evidence of recurrence.   Objective  Left Upper Back, Left mid paraspinal: Scar with no evidence of recurrence.   Objective  Left Lower Back: Scaly erythematous macule      Objective  Left Upper Back (2), Right Upper Back: Open comedome with dark center  Assessment & Plan  History of basal cell carcinoma (BCC) Right Upper Back  Clear today. Observe   History of dysplastic nevus (2) Left Upper Back; Left mid paraspinal  Clear today. Observe.  Neoplasm of skin Left Lower Back  Skin / nail biopsy Type of biopsy: tangential   Informed consent: discussed and consent obtained   Timeout: patient name, date of birth, surgical site, and procedure verified   Procedure prep:  Patient was prepped and draped in usual sterile fashion Prep type:  Chlorhexidine Anesthesia: the lesion was anesthetized in a standard fashion   Anesthetic:  1% lidocaine w/ epinephrine 1-100,000 local infiltration Instrument used: flexible razor blade   Hemostasis achieved with: aluminum chloride   Outcome: patient tolerated procedure well   Post-procedure details: wound care instructions given   Additional details:  PERFORMED ED&C AFTER BIOPSY TX p BX  1.5cm   Destruction of lesion Complexity: extensive   Destruction method: electrodesiccation and curettage   Informed consent: discussed and consent obtained   Timeout:  patient name, date of birth, surgical site, and procedure verified Procedure prep:  Patient was prepped and draped in usual sterile fashion Prep type:  Isopropyl alcohol Anesthesia: the lesion was anesthetized in a standard fashion   Anesthetic:  1% lidocaine w/ epinephrine 1-100,000 buffered w/ 8.4% NaHCO3 Curettage performed in three different directions: Yes   Electrodesiccation performed over the curetted area: Yes   Lesion length (cm):  1.5 Lesion width (cm):  1.5 Margin per side (cm):  0 Final wound size (cm):  1.5 Hemostasis achieved with:  pressure, aluminum chloride and electrodesiccation Outcome: patient tolerated procedure well with no complications   Post-procedure details: sterile dressing applied and wound care instructions given   Dressing type: bandage and petrolatum    Specimen 1 - Surgical pathology Differential Diagnosis: sbcc Check Margins: No  Open comedone (3) Left Upper Back (2); Right Upper Back  I and D  Incision and Drainage - Left Upper Back (2), Right Upper Back Location: left upper back x 2, right upper back x 1  Informed Consent: Discussed risks (permanent scarring, light or dark discoloration, infection, pain, bleeding, bruising, redness, damage to adjacent structures, and recurrence of the lesion) and benefits of the procedure, as well as the alternatives.  Informed consent was obtained.  Preparation: The area was prepped with alcohol.  Anesthesia: Lidocaine 1% with epinephrine  Procedure Details: An incision was made overlying  the lesion. The lesion drained keratin material.  A small amount of fluid was drained.    Antibiotic ointment and a sterile pressure dressing were applied. The patient tolerated procedure well.  Total number of lesions drained: 3  Plan: The patient  was instructed on post-op care. Recommend OTC analgesia as needed for pain.

## 2019-06-05 ENCOUNTER — Telehealth: Payer: Self-pay

## 2019-06-05 NOTE — Telephone Encounter (Signed)
Message left for patient they have access to my chart any further questions call the office.

## 2019-06-05 NOTE — Telephone Encounter (Signed)
-----   Message from Warren Danes, [place] sent at 06/05/2019 12:49 PM EDT ----- benign

## 2019-06-15 ENCOUNTER — Other Ambulatory Visit: Payer: Self-pay | Admitting: Family Medicine

## 2019-06-15 DIAGNOSIS — R7989 Other specified abnormal findings of blood chemistry: Secondary | ICD-10-CM

## 2019-06-19 ENCOUNTER — Other Ambulatory Visit: Payer: Self-pay

## 2019-06-19 ENCOUNTER — Ambulatory Visit
Admission: RE | Admit: 2019-06-19 | Discharge: 2019-06-19 | Disposition: A | Discharge status: Home / Self Care | Payer: BC Managed Care – PPO | Source: Ambulatory Visit | Attending: Family Medicine | Admitting: Family Medicine

## 2019-06-19 DIAGNOSIS — K760 Fatty (change of) liver, not elsewhere classified: Secondary | ICD-10-CM | POA: Diagnosis not present

## 2019-06-19 DIAGNOSIS — R7989 Other specified abnormal findings of blood chemistry: Secondary | ICD-10-CM

## 2019-07-09 ENCOUNTER — Telehealth: Payer: Self-pay | Admitting: Physician Assistant

## 2019-07-09 NOTE — Telephone Encounter (Signed)
Scheduled Thursday w/[facility] for 3 month recall, but was here 4/1. Not sure if he really needs to come back. Call him + let him know. ( Appt. still in system)

## 2019-07-09 NOTE — Telephone Encounter (Signed)
Left message for him to call back if he wants to cancel follow up.

## 2019-07-10 NOTE — Telephone Encounter (Signed)
Voicemail left for patient to inform him that his appointment for this Thursday with [facility] has been cancelled since he was just seen on 04/01.

## 2019-07-12 ENCOUNTER — Ambulatory Visit: Payer: BLUE CROSS/BLUE SHIELD | Admitting: Physician Assistant

## 2019-07-31 NOTE — Addendum Note (Signed)
Addended by: Robyne Askew R on: 07/31/2019 01:51 PM   Modules accepted: Level of Service

## 2019-08-17 DIAGNOSIS — R0683 Snoring: Secondary | ICD-10-CM | POA: Diagnosis not present

## 2019-10-11 DIAGNOSIS — R0681 Apnea, not elsewhere classified: Secondary | ICD-10-CM | POA: Diagnosis not present

## 2019-11-13 DIAGNOSIS — G4733 Obstructive sleep apnea (adult) (pediatric): Secondary | ICD-10-CM | POA: Diagnosis not present

## 2020-02-15 DIAGNOSIS — Z23 Encounter for immunization: Secondary | ICD-10-CM | POA: Diagnosis not present

## 2020-02-15 DIAGNOSIS — Z0000 Encounter for general adult medical examination without abnormal findings: Secondary | ICD-10-CM | POA: Diagnosis not present

## 2020-04-04 DIAGNOSIS — R0982 Postnasal drip: Secondary | ICD-10-CM | POA: Diagnosis not present

## 2020-04-04 DIAGNOSIS — Z20822 Contact with and (suspected) exposure to covid-19: Secondary | ICD-10-CM | POA: Diagnosis not present

## 2020-09-23 DIAGNOSIS — E559 Vitamin D deficiency, unspecified: Secondary | ICD-10-CM | POA: Diagnosis not present

## 2020-09-23 DIAGNOSIS — I10 Essential (primary) hypertension: Secondary | ICD-10-CM | POA: Diagnosis not present

## 2020-09-23 DIAGNOSIS — K760 Fatty (change of) liver, not elsewhere classified: Secondary | ICD-10-CM | POA: Diagnosis not present

## 2020-09-23 DIAGNOSIS — F338 Other recurrent depressive disorders: Secondary | ICD-10-CM | POA: Diagnosis not present

## 2021-03-31 DIAGNOSIS — Z0000 Encounter for general adult medical examination without abnormal findings: Secondary | ICD-10-CM | POA: Diagnosis not present

## 2021-03-31 DIAGNOSIS — I10 Essential (primary) hypertension: Secondary | ICD-10-CM | POA: Diagnosis not present

## 2021-03-31 DIAGNOSIS — E782 Mixed hyperlipidemia: Secondary | ICD-10-CM | POA: Diagnosis not present

## 2021-03-31 DIAGNOSIS — N529 Male erectile dysfunction, unspecified: Secondary | ICD-10-CM | POA: Diagnosis not present

## 2021-03-31 DIAGNOSIS — R7303 Prediabetes: Secondary | ICD-10-CM | POA: Diagnosis not present

## 2021-04-30 DIAGNOSIS — E1169 Type 2 diabetes mellitus with other specified complication: Secondary | ICD-10-CM | POA: Diagnosis not present

## 2021-05-27 ENCOUNTER — Other Ambulatory Visit: Payer: Self-pay | Admitting: Family Medicine

## 2021-05-27 DIAGNOSIS — R7303 Prediabetes: Secondary | ICD-10-CM

## 2021-05-27 DIAGNOSIS — I10 Essential (primary) hypertension: Secondary | ICD-10-CM

## 2021-05-27 DIAGNOSIS — E785 Hyperlipidemia, unspecified: Secondary | ICD-10-CM

## 2021-06-25 ENCOUNTER — Ambulatory Visit
Admission: RE | Admit: 2021-06-25 | Discharge: 2021-06-25 | Disposition: A | Discharge status: Home / Self Care | Payer: No Typology Code available for payment source | Source: Ambulatory Visit | Attending: Family Medicine | Admitting: Family Medicine

## 2021-06-25 DIAGNOSIS — R7303 Prediabetes: Secondary | ICD-10-CM

## 2021-06-25 DIAGNOSIS — I10 Essential (primary) hypertension: Secondary | ICD-10-CM

## 2021-06-25 DIAGNOSIS — E785 Hyperlipidemia, unspecified: Secondary | ICD-10-CM

## 2021-10-06 DIAGNOSIS — I10 Essential (primary) hypertension: Secondary | ICD-10-CM | POA: Diagnosis not present

## 2021-10-06 DIAGNOSIS — F338 Other recurrent depressive disorders: Secondary | ICD-10-CM | POA: Diagnosis not present

## 2021-10-06 DIAGNOSIS — E1169 Type 2 diabetes mellitus with other specified complication: Secondary | ICD-10-CM | POA: Diagnosis not present

## 2022-02-25 DIAGNOSIS — R42 Dizziness and giddiness: Secondary | ICD-10-CM | POA: Diagnosis not present

## 2022-02-25 DIAGNOSIS — H938X2 Other specified disorders of left ear: Secondary | ICD-10-CM | POA: Diagnosis not present

## 2022-04-06 DIAGNOSIS — E1169 Type 2 diabetes mellitus with other specified complication: Secondary | ICD-10-CM | POA: Diagnosis not present

## 2022-04-06 DIAGNOSIS — E559 Vitamin D deficiency, unspecified: Secondary | ICD-10-CM | POA: Diagnosis not present

## 2022-04-06 DIAGNOSIS — N529 Male erectile dysfunction, unspecified: Secondary | ICD-10-CM | POA: Diagnosis not present

## 2022-04-06 DIAGNOSIS — I10 Essential (primary) hypertension: Secondary | ICD-10-CM | POA: Diagnosis not present

## 2022-04-06 DIAGNOSIS — Z0000 Encounter for general adult medical examination without abnormal findings: Secondary | ICD-10-CM | POA: Diagnosis not present

## 2022-09-06 DIAGNOSIS — D124 Benign neoplasm of descending colon: Secondary | ICD-10-CM | POA: Diagnosis not present

## 2022-09-06 DIAGNOSIS — Z09 Encounter for follow-up examination after completed treatment for conditions other than malignant neoplasm: Secondary | ICD-10-CM | POA: Diagnosis not present

## 2022-09-06 DIAGNOSIS — D123 Benign neoplasm of transverse colon: Secondary | ICD-10-CM | POA: Diagnosis not present

## 2022-09-06 DIAGNOSIS — Z86010 Personal history of colonic polyps: Secondary | ICD-10-CM | POA: Diagnosis not present

## 2023-02-10 DIAGNOSIS — N400 Enlarged prostate without lower urinary tract symptoms: Secondary | ICD-10-CM | POA: Diagnosis not present

## 2023-03-04 DIAGNOSIS — H60502 Unspecified acute noninfective otitis externa, left ear: Secondary | ICD-10-CM | POA: Diagnosis not present

## 2023-03-17 DIAGNOSIS — N400 Enlarged prostate without lower urinary tract symptoms: Secondary | ICD-10-CM | POA: Diagnosis not present

## 2023-04-12 DIAGNOSIS — Z0000 Encounter for general adult medical examination without abnormal findings: Secondary | ICD-10-CM | POA: Diagnosis not present

## 2023-04-12 DIAGNOSIS — E559 Vitamin D deficiency, unspecified: Secondary | ICD-10-CM | POA: Diagnosis not present

## 2023-04-12 DIAGNOSIS — N529 Male erectile dysfunction, unspecified: Secondary | ICD-10-CM | POA: Diagnosis not present

## 2023-04-12 DIAGNOSIS — I10 Essential (primary) hypertension: Secondary | ICD-10-CM | POA: Diagnosis not present

## 2023-04-12 DIAGNOSIS — E1169 Type 2 diabetes mellitus with other specified complication: Secondary | ICD-10-CM | POA: Diagnosis not present

## 2023-04-29 DIAGNOSIS — N401 Enlarged prostate with lower urinary tract symptoms: Secondary | ICD-10-CM | POA: Diagnosis not present

## 2023-04-29 DIAGNOSIS — N138 Other obstructive and reflux uropathy: Secondary | ICD-10-CM | POA: Diagnosis not present

## 2023-05-13 DIAGNOSIS — N401 Enlarged prostate with lower urinary tract symptoms: Secondary | ICD-10-CM | POA: Diagnosis not present

## 2023-05-13 DIAGNOSIS — Z888 Allergy status to other drugs, medicaments and biological substances status: Secondary | ICD-10-CM | POA: Diagnosis not present

## 2023-05-13 DIAGNOSIS — N400 Enlarged prostate without lower urinary tract symptoms: Secondary | ICD-10-CM | POA: Diagnosis not present

## 2023-05-13 DIAGNOSIS — N138 Other obstructive and reflux uropathy: Secondary | ICD-10-CM | POA: Diagnosis not present

## 2023-05-13 DIAGNOSIS — Z79899 Other long term (current) drug therapy: Secondary | ICD-10-CM | POA: Diagnosis not present

## 2023-05-25 DIAGNOSIS — N401 Enlarged prostate with lower urinary tract symptoms: Secondary | ICD-10-CM | POA: Diagnosis not present

## 2023-05-25 DIAGNOSIS — N138 Other obstructive and reflux uropathy: Secondary | ICD-10-CM | POA: Diagnosis not present

## 2023-10-17 DIAGNOSIS — E559 Vitamin D deficiency, unspecified: Secondary | ICD-10-CM | POA: Diagnosis not present

## 2023-10-17 DIAGNOSIS — E782 Mixed hyperlipidemia: Secondary | ICD-10-CM | POA: Diagnosis not present

## 2023-10-17 DIAGNOSIS — N529 Male erectile dysfunction, unspecified: Secondary | ICD-10-CM | POA: Diagnosis not present

## 2023-10-17 DIAGNOSIS — E1169 Type 2 diabetes mellitus with other specified complication: Secondary | ICD-10-CM | POA: Diagnosis not present

## 2023-10-17 DIAGNOSIS — I10 Essential (primary) hypertension: Secondary | ICD-10-CM | POA: Diagnosis not present

## 2023-10-21 DIAGNOSIS — K4090 Unilateral inguinal hernia, without obstruction or gangrene, not specified as recurrent: Secondary | ICD-10-CM | POA: Diagnosis not present

## 2024-02-15 DIAGNOSIS — K4090 Unilateral inguinal hernia, without obstruction or gangrene, not specified as recurrent: Secondary | ICD-10-CM | POA: Diagnosis not present

## 2024-02-15 DIAGNOSIS — D176 Benign lipomatous neoplasm of spermatic cord: Secondary | ICD-10-CM | POA: Diagnosis not present

## 2024-02-26 IMAGING — CT CT CARDIAC CORONARY ARTERY CALCIUM SCORE
3 series · 14 of 20 positions shown, 16 images · non-contrast
Comparison: 03/23/2012 chest radiograph

CLINICAL DATA: Increased cholesterol.  Prediabetic.

EXAM:
CT CARDIAC CORONARY ARTERY CALCIUM SCORE
TECHNIQUE: Non-contrast imaging through the heart was performed using
prospective ECG gating. Image post processing was performed on an
independent workstation, allowing for quantitative analysis of the
heart and coronary arteries. Note that this exam targets the heart
and the chest was not imaged in its entirety.

[Series 2: calcium scoring 2.00 qr36 bestdiast 70% hrt calciu · axial · 0.38mm/px · z∈[+1586,+1682]mm · 4 of 80 slices shown]
[im 16/80  vessel]
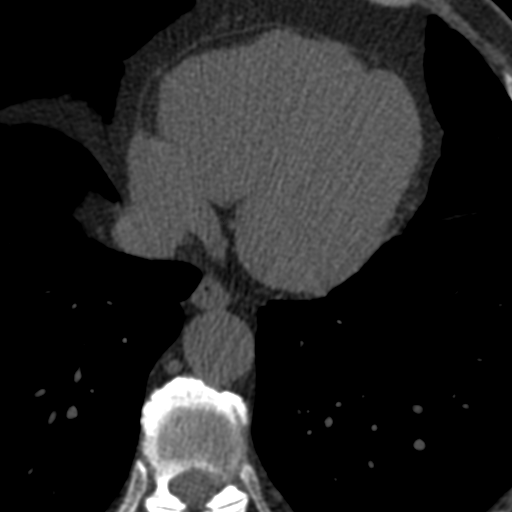
[im 32/80  vessel]
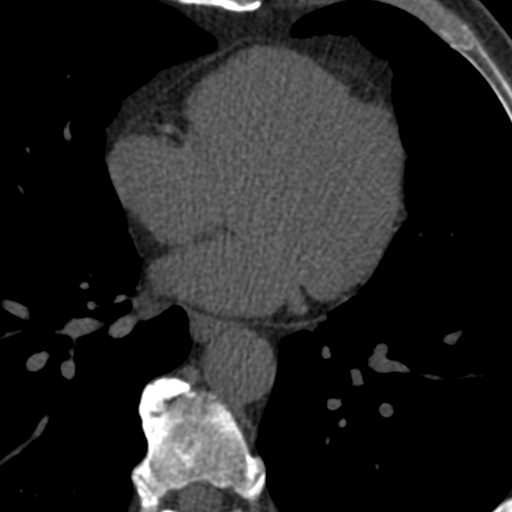
[im 48/80  vessel]
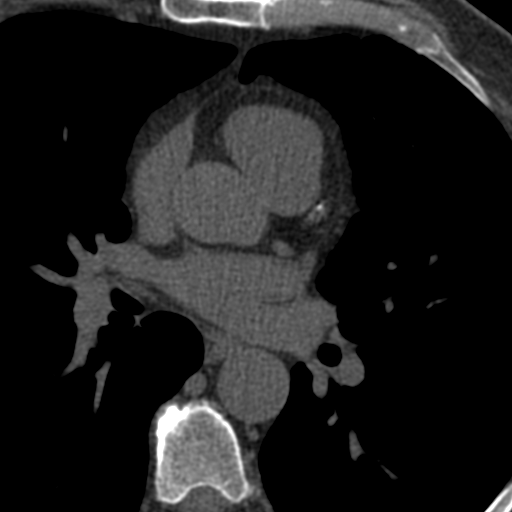
[im 64/80  vessel]
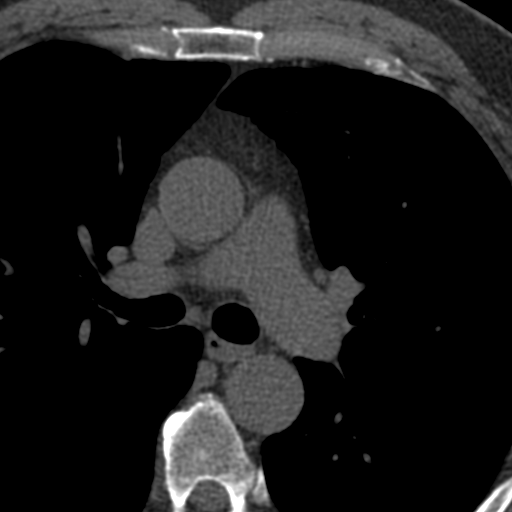

[Series 3: calcium scoring 2.00 br40 bestdiast 70% axial · axial · 0.62mm/px · z∈[+1582,+1686]mm · 5 of 80 slices shown, 7 images]
[im 14/80  vessel]
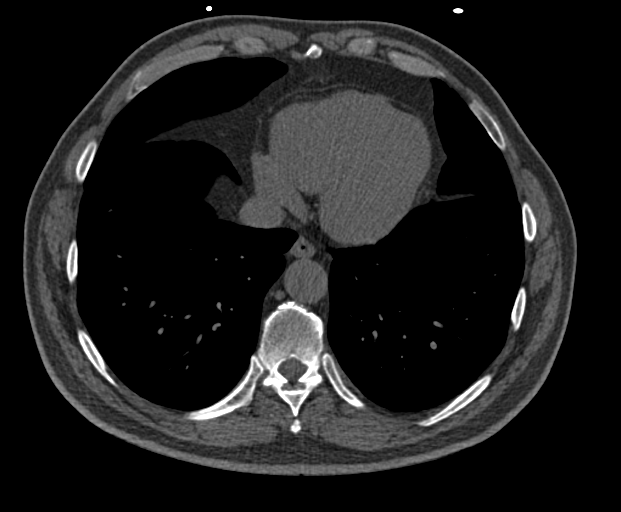
[im 14/80  lung]
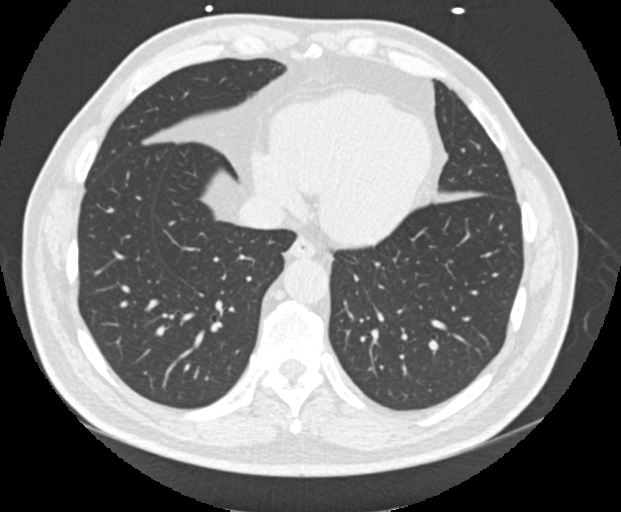
[im 27/80  vessel]
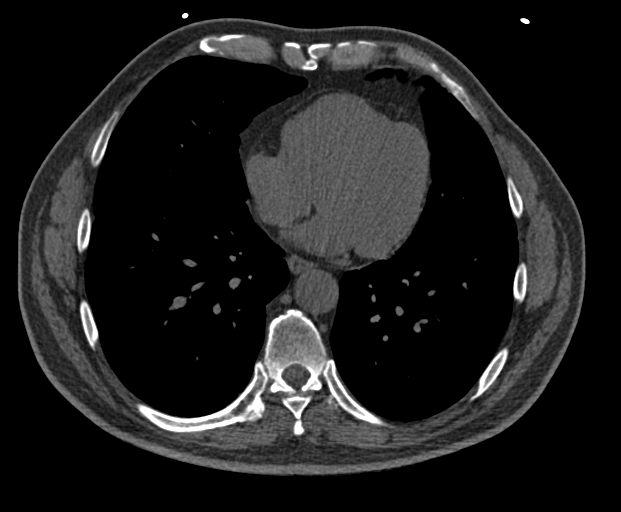
[im 40/80  vessel]
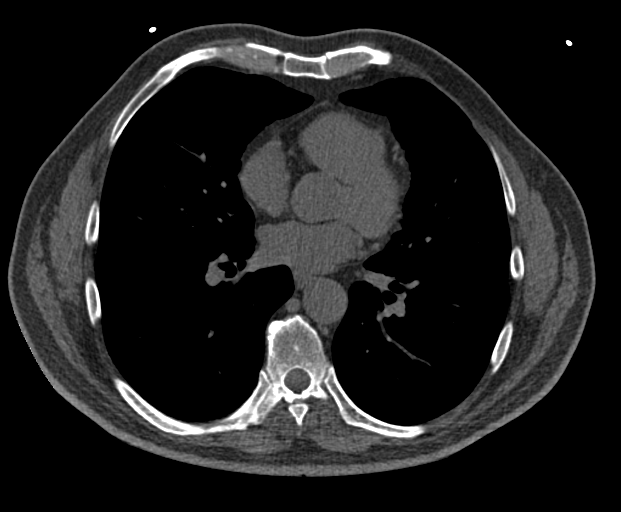
[im 53/80  vessel]
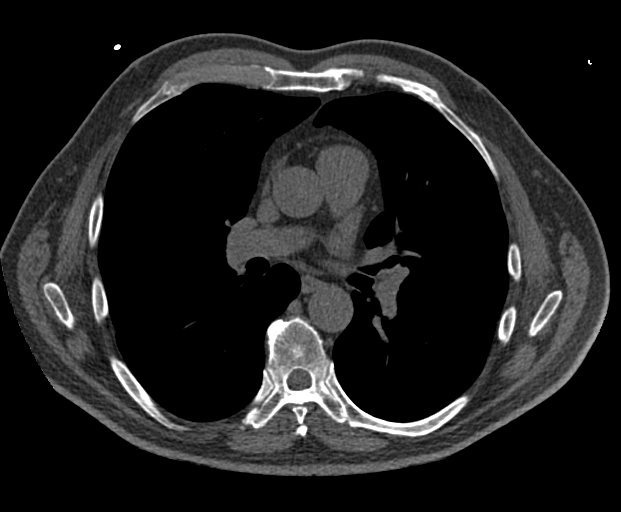
[im 66/80  vessel]
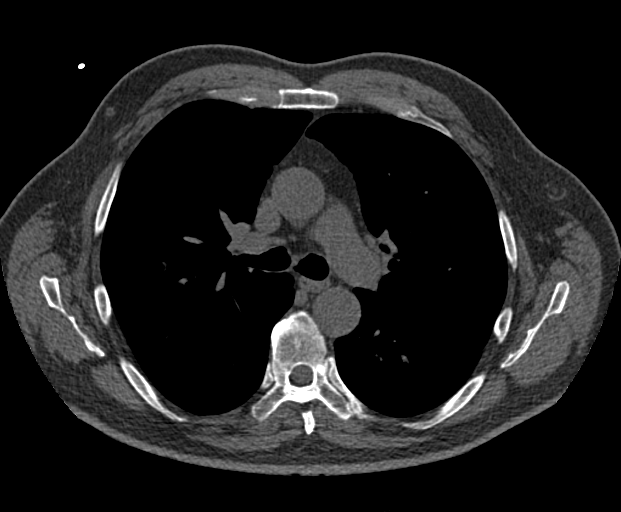
[im 66/80  lung]
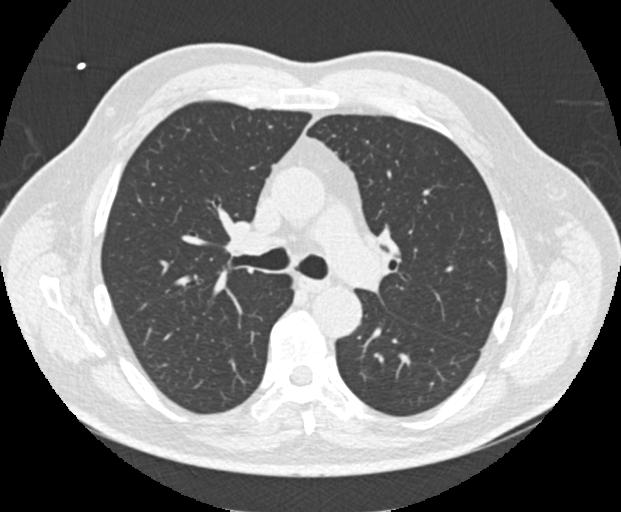

[Series 9: calcium scoring 2.00 br60 bestdiast 70% lungs · axial · 0.62mm/px · z∈[+1582,+1686]mm · 5 of 80 slices shown]
[im 14/80  vessel]
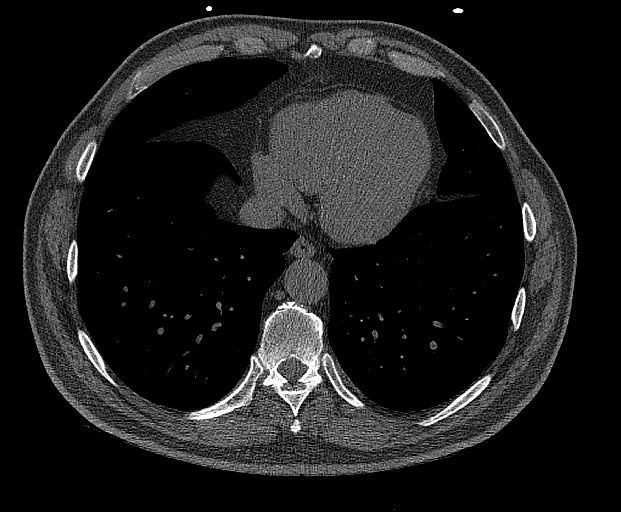
[im 27/80  vessel]
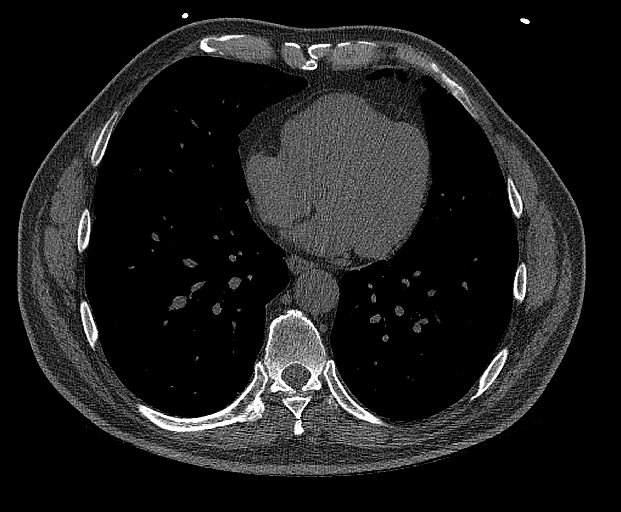
[im 40/80  vessel]
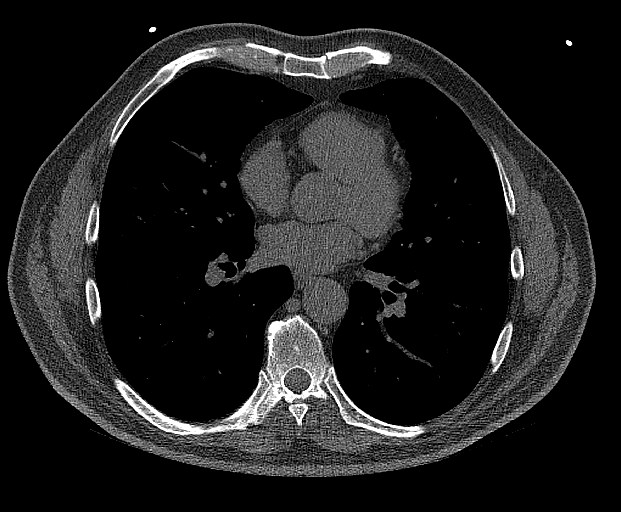
[im 53/80  vessel]
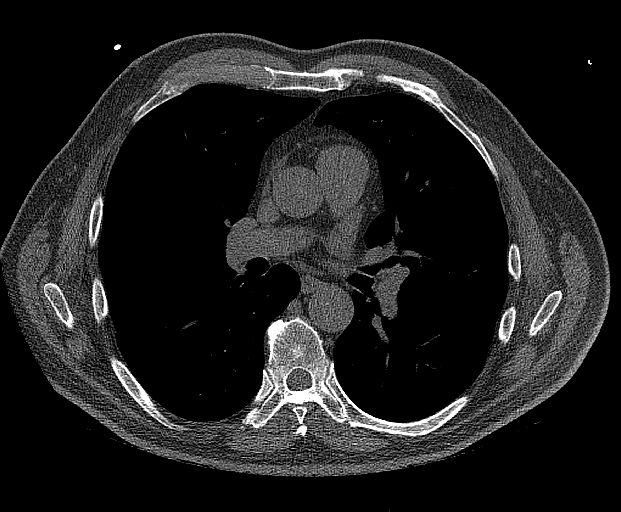
[im 66/80  vessel]
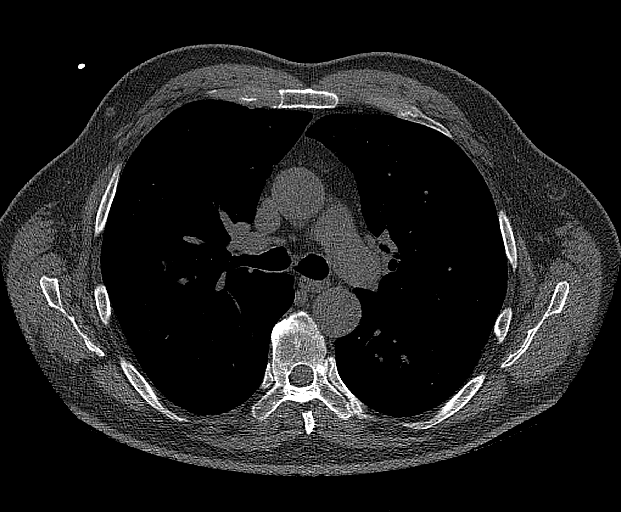

[14 of 20 positions shown; findings below may reference images not displayed]

FINDINGS: CORONARY CALCIUM SCORES:

Left Main: 0

LAD:

LCx:

RCA:

Total Agatston Score:

[HOSPITAL] percentile: 46

AORTA MEASUREMENTS:

Ascending Aorta: 31 mm

Descending Aorta: 27 mm

OTHER FINDINGS:

Cardiovascular: Aortic atherosclerosis. Normal heart size, without
pericardial effusion.

Mediastinum/Nodes: No imaged thoracic adenopathy.

Lungs/Pleura: No pleural fluid.  Clear imaged lungs.

Upper Abdomen: Normal imaged portions of the liver, spleen.

Musculoskeletal: No acute osseous abnormality.
IMPRESSION: 1. Total Agatston score of 31.2, corresponding to 46th percentile
for age, sex, and race based cohort.
2. Aortic Atherosclerosis (H1IQ0-FW8.8).
